# Patient Record
Sex: Female | Born: 1995 | Race: White | Hispanic: No | Marital: Single | Smoking: Never smoker
Health system: Southern US, Community
[De-identification: ages and names within clinical notes are randomized; demographics above are authoritative.]

## PROBLEM LIST (undated history)

## (undated) HISTORY — PX: FEMUR SURGERY: SHX943

---

## 2015-10-21 ENCOUNTER — Emergency Department
Admission: EM | Admit: 2015-10-21 | Discharge: 2015-10-21 | Disposition: A | Discharge status: Home / Self Care | Payer: Medicaid Other | Attending: Emergency Medicine | Admitting: Emergency Medicine

## 2015-10-21 ENCOUNTER — Encounter: Payer: Self-pay | Admitting: Emergency Medicine

## 2015-10-21 ENCOUNTER — Emergency Department: Payer: Medicaid Other

## 2015-10-21 DIAGNOSIS — R1011 Right upper quadrant pain: Secondary | ICD-10-CM | POA: Diagnosis not present

## 2015-10-21 DIAGNOSIS — R001 Bradycardia, unspecified: Secondary | ICD-10-CM | POA: Diagnosis not present

## 2015-10-21 DIAGNOSIS — Z3202 Encounter for pregnancy test, result negative: Secondary | ICD-10-CM | POA: Diagnosis not present

## 2015-10-21 DIAGNOSIS — R945 Abnormal results of liver function studies: Secondary | ICD-10-CM | POA: Insufficient documentation

## 2015-10-21 DIAGNOSIS — B9789 Other viral agents as the cause of diseases classified elsewhere: Secondary | ICD-10-CM

## 2015-10-21 DIAGNOSIS — R112 Nausea with vomiting, unspecified: Secondary | ICD-10-CM | POA: Diagnosis not present

## 2015-10-21 DIAGNOSIS — J069 Acute upper respiratory infection, unspecified: Secondary | ICD-10-CM

## 2015-10-21 DIAGNOSIS — R7989 Other specified abnormal findings of blood chemistry: Secondary | ICD-10-CM

## 2015-10-21 DIAGNOSIS — R05 Cough: Secondary | ICD-10-CM | POA: Diagnosis present

## 2015-10-21 LAB — COMPREHENSIVE METABOLIC PANEL
ALBUMIN: 4.2 g/dL (ref 3.5–5.0)
ALK PHOS: 105 U/L (ref 38–126)
ALT: 260 U/L — AB (ref 14–54)
AST: 190 U/L — AB (ref 15–41)
Anion gap: 8 (ref 5–15)
BILIRUBIN TOTAL: 0.9 mg/dL (ref 0.3–1.2)
BUN: 11 mg/dL (ref 6–20)
CO2: 22 mmol/L (ref 22–32)
Calcium: 9 mg/dL (ref 8.9–10.3)
Chloride: 106 mmol/L (ref 101–111)
Creatinine, Ser: 0.77 mg/dL (ref 0.44–1.00)
GFR calc Af Amer: >60 mL/min (ref >60)
GFR calc non Af Amer: >60 mL/min (ref >60)
GLUCOSE: 89 mg/dL (ref 65–99)
POTASSIUM: 3.9 mmol/L (ref 3.5–5.1)
Sodium: 136 mmol/L (ref 135–145)
TOTAL PROTEIN: 7.7 g/dL (ref 6.5–8.1)

## 2015-10-21 LAB — CBC WITH DIFFERENTIAL/PLATELET
BAND NEUTROPHILS: 0 %
BASOS PCT: 0 %
Basophils Absolute: 0 10*3/uL (ref 0–0.1)
Blasts: 0 %
EOS ABS: 0 10*3/uL (ref 0–0.7)
Eosinophils Relative: 0 %
HCT: 35 % (ref 35.0–47.0)
Hemoglobin: 11.9 g/dL — ABNORMAL LOW (ref 12.0–16.0)
LYMPHS PCT: 55 %
Lymphs Abs: 7.4 10*3/uL — ABNORMAL HIGH (ref 1.0–3.6)
MCH: 29.5 pg (ref 26.0–34.0)
MCHC: 33.9 g/dL (ref 32.0–36.0)
MCV: 87 fL (ref 80.0–100.0)
MONO ABS: 1.4 10*3/uL — AB (ref 0.2–0.9)
Metamyelocytes Relative: 1 %
Monocytes Relative: 10 %
Myelocytes: 0 %
NEUTROS ABS: 4.3 10*3/uL (ref 1.4–6.5)
Neutrophils Relative %: 31 %
OTHER: 3 %
PLATELETS: 157 10*3/uL (ref 150–440)
PROMYELOCYTES ABS: 0 %
RBC: 4.02 MIL/uL (ref 3.80–5.20)
RDW: 14.6 % — AB (ref 11.5–14.5)
WBC: 13.5 10*3/uL — ABNORMAL HIGH (ref 3.6–11.0)
nRBC: 0 /100 WBC

## 2015-10-21 LAB — URINALYSIS COMPLETE WITH MICROSCOPIC (ARMC ONLY)
BILIRUBIN URINE: NEGATIVE
Glucose, UA: NEGATIVE mg/dL
Hgb urine dipstick: NEGATIVE
KETONES UR: NEGATIVE mg/dL
Leukocytes, UA: NEGATIVE
Nitrite: NEGATIVE
Protein, ur: NEGATIVE mg/dL
RBC / HPF: NONE SEEN RBC/hpf (ref 0–5)
Specific Gravity, Urine: 1.003 — ABNORMAL LOW (ref 1.005–1.030)
WBC, UA: NONE SEEN WBC/hpf (ref 0–5)
pH: 7 (ref 5.0–8.0)

## 2015-10-21 LAB — MONONUCLEOSIS SCREEN: Mono Screen: NEGATIVE

## 2015-10-21 LAB — LIPASE, BLOOD: Lipase: 24 U/L (ref 11–51)

## 2015-10-21 LAB — PATHOLOGIST SMEAR REVIEW

## 2015-10-21 LAB — POCT PREGNANCY, URINE: Preg Test, Ur: NEGATIVE

## 2015-10-21 MED ORDER — SODIUM CHLORIDE 0.9 % IV BOLUS (SEPSIS)
1000.0000 mL | Freq: Once | INTRAVENOUS | Status: AC
  Administered 2015-10-21: 1000 mL via INTRAVENOUS

## 2015-10-21 MED ORDER — ONDANSETRON HCL 4 MG/2ML IJ SOLN
4.0000 mg | Freq: Once | INTRAMUSCULAR | Status: AC
  Administered 2015-10-21: 4 mg via INTRAVENOUS
  Filled 2015-10-21: qty 2

## 2015-10-21 MED ORDER — ONDANSETRON HCL 4 MG PO TABS: 4.0000 mg | ORAL_TABLET | Freq: Three times a day (TID) | ORAL | Status: AC | PRN

## 2015-10-21 NOTE — Discharge Instructions (Signed)
You were evaluated for 1 week of cough, and episode of abdominal pain and a couple episodes of vomiting, and although no certain cause was found, your exam and evaluation are reassuring in the emergency department today.  Return to the emergency department for any worsening symptoms including fever, new or worsening abdominal pain, pelvic pain, trouble breathing, or any other symptoms concerning to you.  Your liver enzymes were slightly elevated, and he should have this rechecked with the primary care physician and you are [facility] but you may try student health and see if they can follow you up this week.   Upper Respiratory Infection, Adult Most upper respiratory infections (URIs) are a viral infection of the air passages leading to the lungs. A URI affects the nose, throat, and upper air passages. The most common type of URI is nasopharyngitis and is typically referred to as "the common cold." URIs run their course and usually go away on their own. Most of the time, a URI does not require medical attention, but sometimes a bacterial infection in the upper airways can follow a viral infection. This is called a secondary infection. Sinus and middle ear infections are common types of secondary upper respiratory infections. Bacterial pneumonia can also complicate a URI. A URI can worsen asthma and chronic obstructive pulmonary disease (COPD). Sometimes, these complications can require emergency medical care and may be life threatening.  CAUSES Almost all URIs are caused by viruses. A virus is a type of germ and can spread from one person to another.  RISKS FACTORS You may be at risk for a URI if:   You smoke.   You have chronic heart or lung disease.  You have a weakened defense (immune) system.   You are very young or very old.   You have nasal allergies or asthma.  You work in crowded or poorly ventilated areas.  You work in health care facilities or  schools. SIGNS AND SYMPTOMS  Symptoms typically develop 2-3 days after you come in contact with a cold virus. Most viral URIs last 7-10 days. However, viral URIs from the influenza virus (flu virus) can last 14-18 days and are typically more severe. Symptoms may include:   Runny or stuffy (congested) nose.   Sneezing.   Cough.   Sore throat.   Headache.   Fatigue.   Fever.   Loss of appetite.   Pain in your forehead, behind your eyes, and over your cheekbones (sinus pain).  Muscle aches.  DIAGNOSIS  Your health care provider may diagnose a URI by:  Physical exam.  Tests to check that your symptoms are not due to another condition such as:  Strep throat.  Sinusitis.  Pneumonia.  Asthma. TREATMENT  A URI goes away on its own with time. It cannot be cured with medicines, but medicines may be prescribed or recommended to relieve symptoms. Medicines may help:  Reduce your fever.  Reduce your cough.  Relieve nasal congestion. HOME CARE INSTRUCTIONS   Take medicines only as directed by your health care provider.   Gargle warm saltwater or take cough drops to comfort your throat as directed by your health care provider.  Use a warm mist humidifier or inhale steam from a shower to increase air moisture. This may make it easier to breathe.  Drink enough fluid to keep your urine clear or pale yellow.   Eat soups and other clear broths and maintain good nutrition.   Rest as needed.  Return to work when your temperature has returned to normal or as your health care provider advises. You may need to stay home longer to avoid infecting others. You can also use a face mask and careful hand washing to prevent spread of the virus.  Increase the usage of your inhaler if you have asthma.   Do not use any tobacco products, including cigarettes, chewing tobacco, or electronic cigarettes. If you need help quitting, ask your health care provider. PREVENTION   The best way to protect yourself from getting a cold is to practice good hygiene.   Avoid oral or hand contact with people with cold symptoms.   Wash your hands often if contact occurs.  There is no clear evidence that vitamin C, vitamin E, echinacea, or exercise reduces the chance of developing a cold. However, it is always recommended to get plenty of rest, exercise, and practice good nutrition.  SEEK MEDICAL CARE IF:   You are getting worse rather than better.   Your symptoms are not controlled by medicine.   You have chills.  You have worsening shortness of breath.  You have brown or red mucus.  You have yellow or brown nasal discharge.  You have pain in your face, especially when you bend forward.  You have a fever.  You have swollen neck glands.  You have pain while swallowing.  You have white areas in the back of your throat. SEEK IMMEDIATE MEDICAL CARE IF:   You have severe or persistent:  Headache.  Ear pain.  Sinus pain.  Chest pain.  You have chronic lung disease and any of the following:  Wheezing.  Prolonged cough.  Coughing up blood.  A change in your usual mucus.  You have a stiff neck.  You have changes in your:  Vision.  Hearing.  Thinking.  Mood. MAKE SURE YOU:   Understand these instructions.  Will watch your condition.  Will get help right away if you are not doing well or get worse.   This information is not intended to replace advice given to you by your health care provider. Make sure you discuss any questions you have with your health care provider.   Document Released: 01/30/2001 Document Revised: 12/21/2014 Document Reviewed: 11/11/2013 Elsevier Interactive Patient Education 2016 [employer]  Abdominal Pain, Adult Many things can cause belly (abdominal) pain. Most times, the belly pain is not dangerous. Many cases of belly pain can be watched and treated at home. HOME CARE   Do not take medicines that  help you go poop (laxatives) unless told to by your doctor.  Only take medicine as told by your doctor.  Eat or drink as told by your doctor. Your doctor will tell you if you should be on a special diet. GET HELP IF:  You do not know what is causing your belly pain.  You have belly pain while you are sick to your stomach (nauseous) or have runny poop (diarrhea).  You have pain while you pee or poop.  Your belly pain wakes you up at night.  You have belly pain that gets worse or better when you eat.  You have belly pain that gets worse when you eat fatty foods.  You have a fever. GET HELP RIGHT AWAY IF:   The pain does not go away within 2 hours.  You keep throwing up (vomiting).  The pain changes and is only in the right or left part of the belly.  You have bloody or  tarry looking poop. MAKE SURE YOU:   Understand these instructions.  Will watch your condition.  Will get help right away if you are not doing well or get worse.   This information is not intended to replace advice given to you by your health care provider. Make sure you discuss any questions you have with your health care provider.   Document Released: 01/23/2008 Document Revised: 08/27/2014 Document Reviewed: 04/15/2013 Elsevier Interactive Patient Education [employer]

## 2015-10-21 NOTE — ED Provider Notes (Signed)
[facility] Emergency Department Provider Note   ____________________________________________  Time seen: Approximately 9:45 AM I have reviewed the triage vital signs and the triage nursing note.  HISTORY  Chief Complaint Emesis; Cough; and Abdominal Pain   Historian Patient  HPI Grace Bridges is a 20 y.o. 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 She states that she has felt under the weather for about a week with mild cough, reported swollen lymph nodes in her neck, and nausea. She vomited once last week and once today prior to arrival. This morning she had a sharp pain on the right upper abdomen area. She came in because she is concerned she could have appendicitis. No prior surgeries. No pelvic pain. No urinary symptoms. No fever.    History reviewed. No pertinent past medical history.  There are no active problems to display for this patient.   Past Surgical History  Procedure Laterality Date  . Femur surgery      No current outpatient prescriptions on file.  Allergies Review of patient's allergies indicates no known allergies.  No family history on file.  Social History Social History  Substance Use Topics  . Smoking status: Never Smoker   . Smokeless tobacco: None  . Alcohol Use: No    Review of Systems  Constitutional: Negative for fever. Eyes: Negative for visual changes. ENT: Negative for sore throat. Cardiovascular: Negative for chest pain. Respiratory: Positive for mild cough. Nonproductive. Gastrointestinal: Negative for diarrhea Genitourinary: Negative for dysuria. Musculoskeletal: Negative for back pain. Skin: Negative for rash. Neurological: Negative for headache. 10 point Review of Systems otherwise negative ____________________________________________   PHYSICAL EXAM:  VITAL SIGNS: ED Triage Vitals  Enc Vitals  Group     BP 10/21/15 0831 127/85 mmHg     Pulse Rate 10/21/15 0831 85     Resp 10/21/15 0831 18     Temp 10/21/15 0831 98.2 F (36.8 C)     Temp Source 10/21/15 0831 Oral     SpO2 10/21/15 0831 98 %     Weight 10/21/15 0831 130 lb (58.968 kg)     Height 10/21/15 0831 5\' 10"  (1.778 m)     Head Cir --      Peak Flow --      Pain Score 10/21/15 0831 6     Pain Loc --      Pain Edu? --      Excl. in GC? --      Constitutional: Alert and oriented. Well appearing and in no distress. HEENT   Head: Normocephalic and atraumatic.      Eyes: Conjunctivae are normal. PERRL. Normal extraocular movements.      Ears:         Nose: No congestion/rhinnorhea.   Mouth/Throat: Mucous membranes are mildly dry.   Neck: No stridor. Cardiovascular/Chest: Bradycardic regular..  No murmurs, rubs, or gallops. Respiratory: Normal respiratory effort without tachypnea nor retractions. Mild rhonchi right posterior base. Gastrointestinal: Soft. No distention, no guarding, no rebound. Nontender.    Genitourinary/rectal:Deferred Musculoskeletal: Nontender with normal range of motion in all extremities. No joint effusions.  No lower extremity tenderness.  No edema. Neurologic:  Normal speech and language. No gross or focal neurologic deficits are appreciated. Skin:  Skin is warm, dry and intact. No rash noted. Psychiatric: Mood and affect are normal. Speech and behavior are normal. Patient exhibits appropriate insight and judgment.  ____________________________________________  EKG I, Governor Rooks, MD, the attending physician have personally viewed and interpreted all ECGs.  None ____________________________________________  LABS (pertinent positives/negatives)  Urine pregnancy test negative Conference of metabolic panel significant for AST 190, a LT to 16 Lipase 24 White blood cell count 13.5, hemoglobin 11.9 Urinalysis negative except for rare bacteria Monospot  negative  ____________________________________________  RADIOLOGY All Xrays were viewed by me. Imaging interpreted by Radiologist.  Chest 2 view:  No edema or consolidation. Scarring left apex  Ultrasound right upper quadrant: Negative. No hepatobiliary abnormality identified. __________________________________________  PROCEDURES  Procedure(s) performed: None  Critical Care performed: None  ____________________________________________   ED COURSE / ASSESSMENT AND PLAN  Pertinent labs & imaging results that were available during my care of the patient were reviewed by me and considered in my medical decision making (see chart for details).   Patient is very well-appearing on exam, has no abdominal pain to palpation now. She pointed to her right upper quadrant, when describing where the pain was earlier. No right lower moderate tenderness to palpation. No pelvic pain or complaints.  She has had minor URI symptoms for about a week with some intermittent nausea and a few episodes of vomiting. She describes the lactose intolerant. Denies other symptoms of GERD.   Laboratory studies are reassuring except mild elevation of AST and ALT. Right upper quadrant ultrasound was obtained, and negative. I did add on the viral hepatitis B and C which will be sent out.  I discussed patient's results with her as well as return precautions and follow-up plan, it was student health or [facility].    CONSULTATIONS:   None   Patient / Family / Caregiver informed of clinical course, medical decision-making process, and agree with plan.   I discussed return precautions, follow-up instructions, and discharged instructions with patient and/or family.   ___________________________________________   FINAL CLINICAL IMPRESSION(S) / ED DIAGNOSES   Final diagnoses:  Abnormal LFTs  Upper respiratory infection  Right upper quadrant pain              Note: This dictation was  prepared with Dragon dictation. Any transcriptional errors that result from this process are unintentional   Governor Rooks, MD 10/21/15 1310

## 2015-10-21 NOTE — ED Notes (Signed)
Patient c/o RUQ abd pain. However no pain is present at the time of RN exam. Nontender. + Bowel Sounds x4. No alterations in bowel or bladder habits. Hx of N/V. Patient states possible lactose intolerance. Patient is A+Ox4 NAD noted

## 2015-10-21 NOTE — ED Notes (Signed)
Cold symptoms, vomiting off and on for a few days, RUQ pain began last weekend. Pt appears in no distress.

## 2015-10-22 LAB — HEPATITIS B SURFACE ANTIBODY, QUANTITATIVE: HEPATITIS B-POST: 59 m[IU]/mL

## 2015-10-22 LAB — HEPATITIS B CORE ANTIBODY, TOTAL: Hep B Core Total Ab: NEGATIVE

## 2015-10-22 LAB — HEPATITIS C ANTIBODY: HCV Ab: <0.1 s/co ratio (ref 0.0–0.9)

## 2015-10-22 LAB — HEPATITIS B SURFACE ANTIGEN: Hepatitis B Surface Ag: NEGATIVE

## 2017-10-26 IMAGING — US US ABDOMEN LIMITED
1 series · 14 of 25 positions shown · non-contrast
Comparison: None.

CLINICAL DATA: Elevated liver function tests.

EXAM:
US ABDOMEN LIMITED - RIGHT UPPER QUADRANT

[Series 1: us abdomen limited · 0.13mm/px · 14 of 77 slices shown]
[im 1/77]
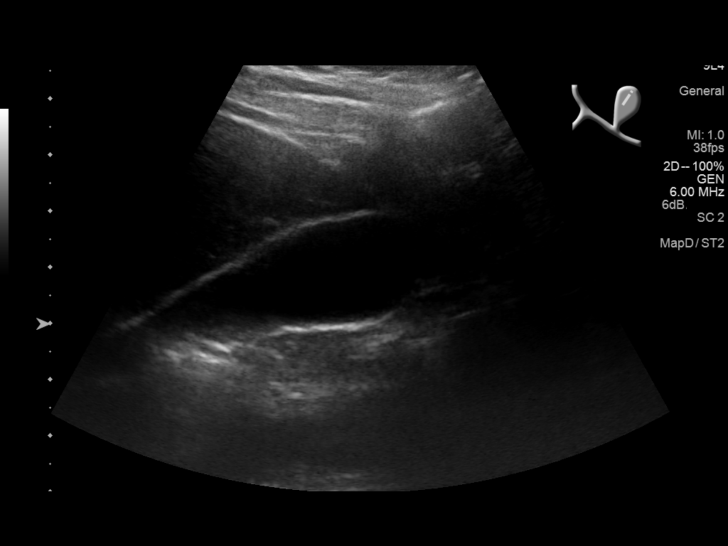
[im 7/77]
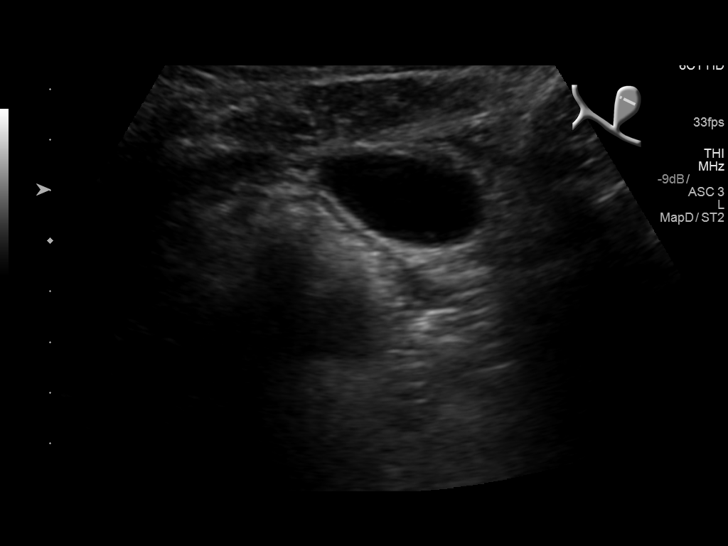
[im 13/77]
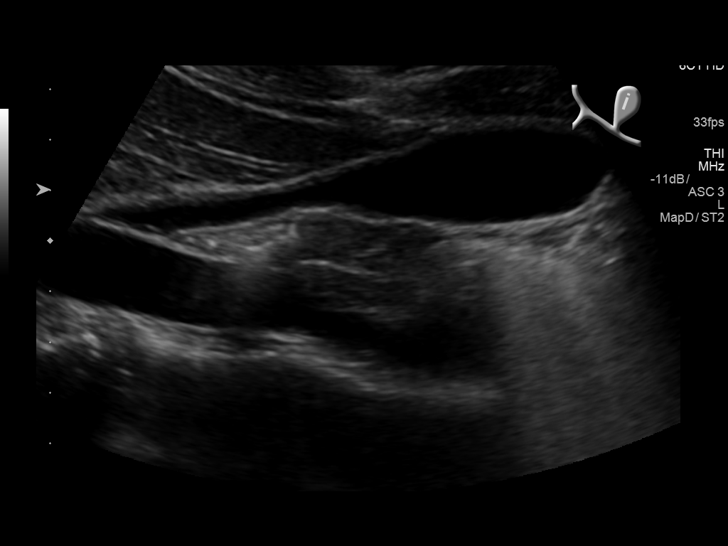
[im 20/77]
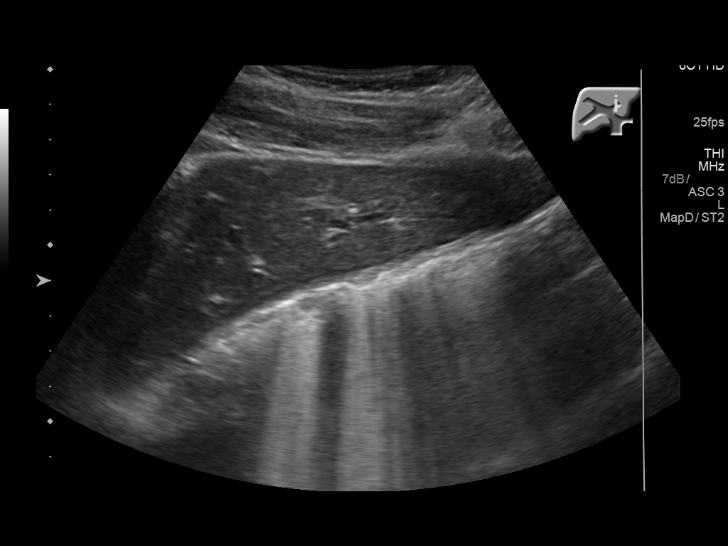
[im 26/77]
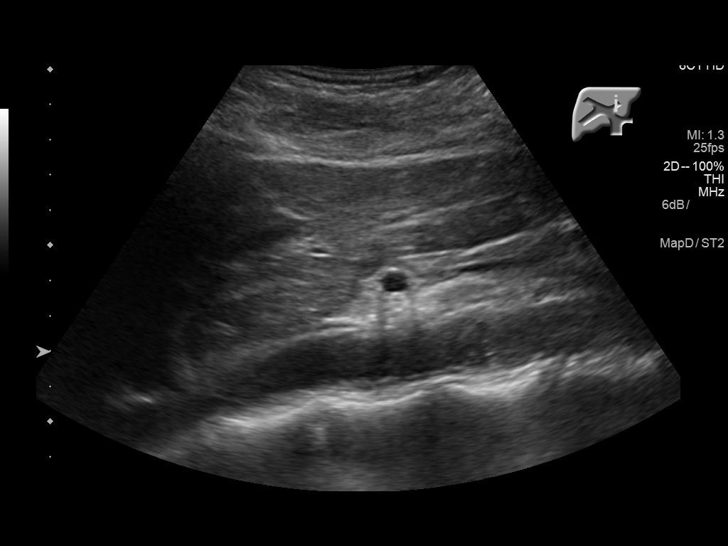
[im 29/77]
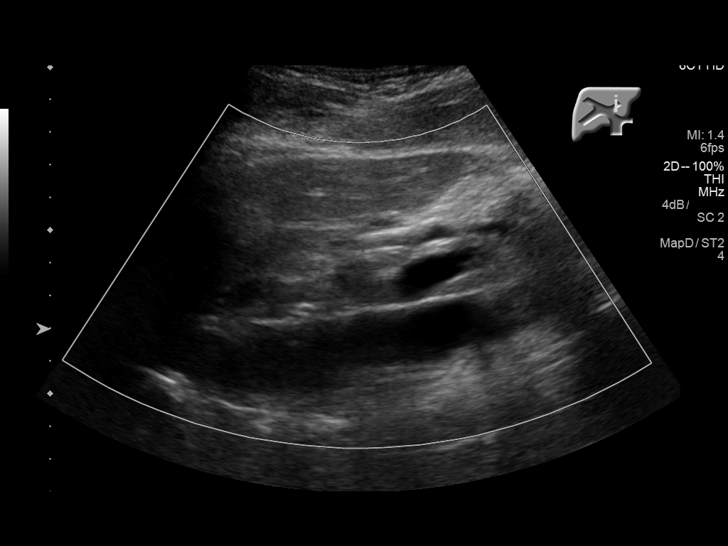
[im 35/77]
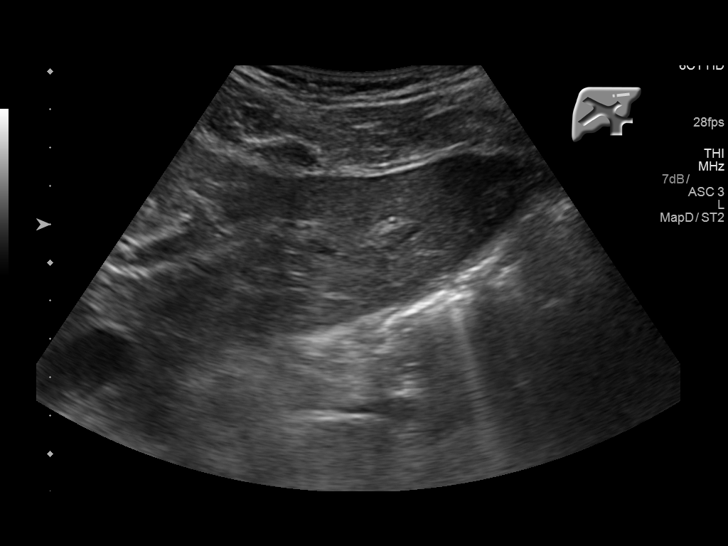
[im 42/77]
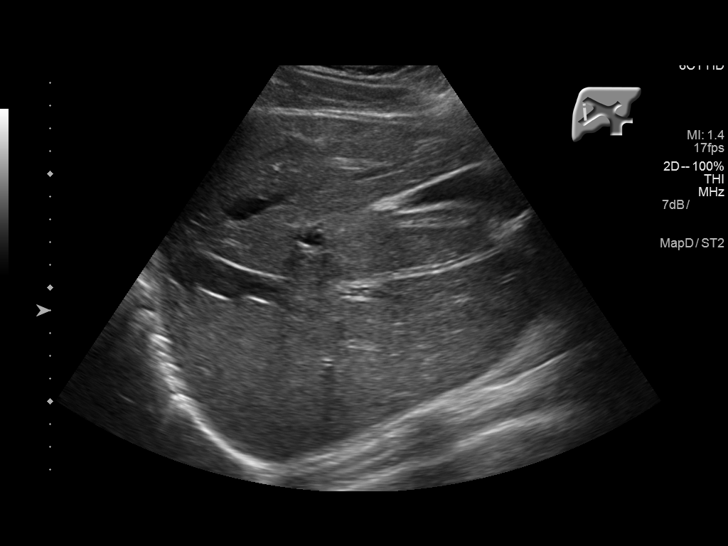
[im 48/77]
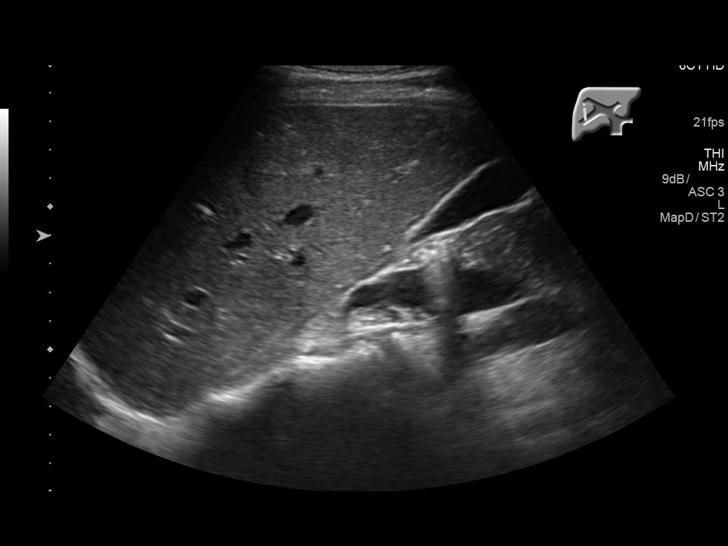
[im 51/77]
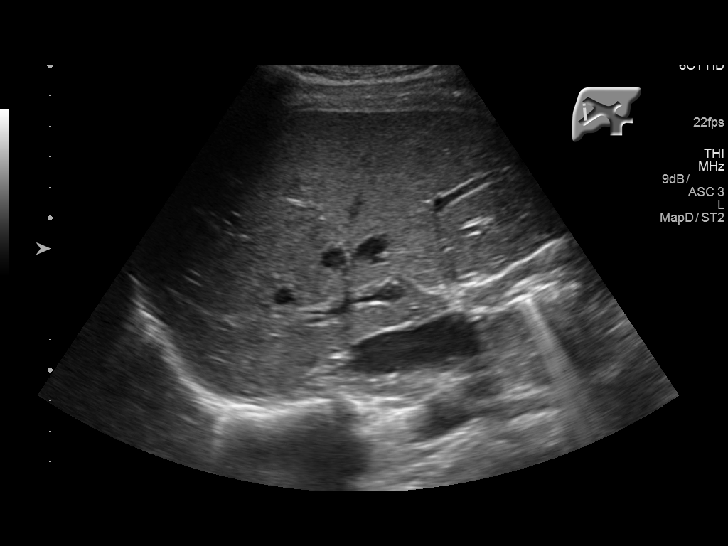
[im 58/77]
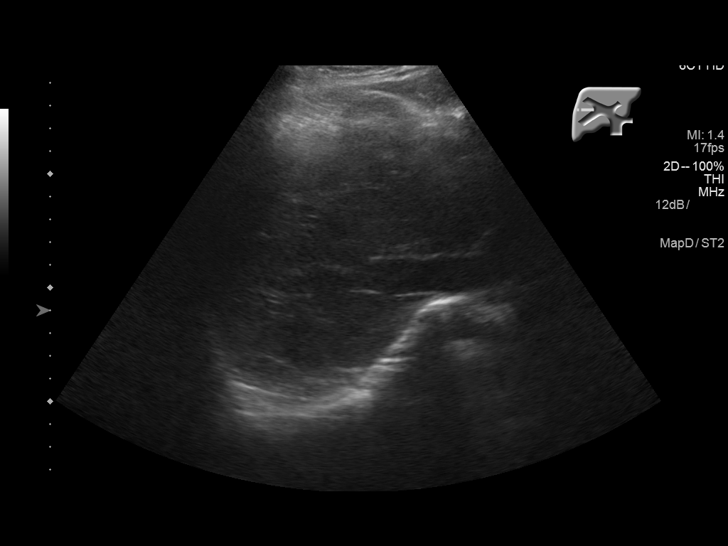
[im 64/77]
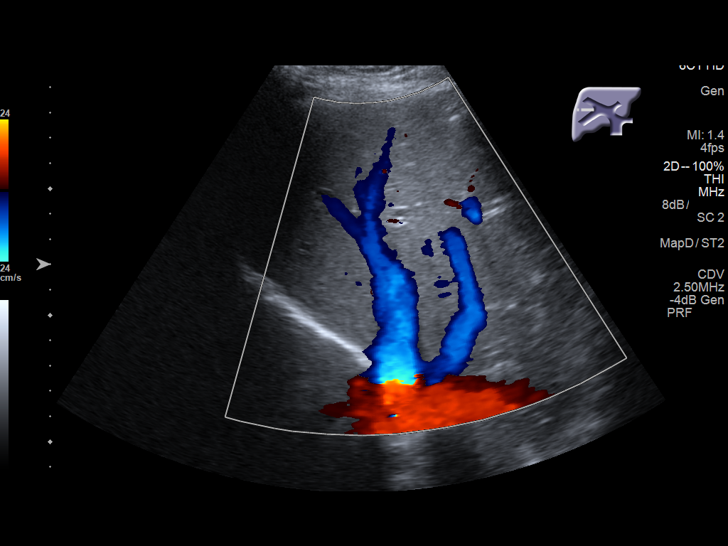
[im 70/77]
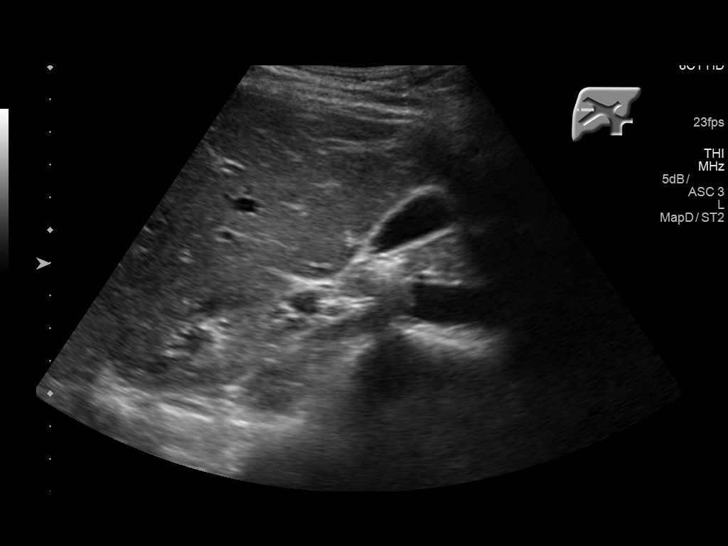
[im 77/77]
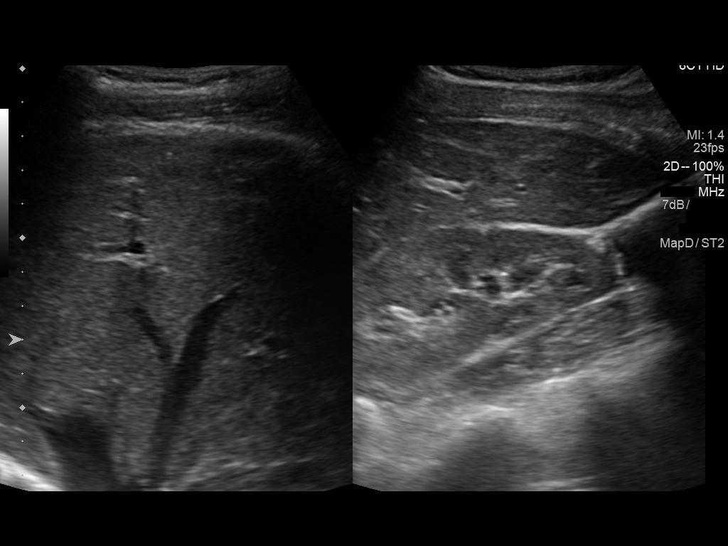

[14 of 25 positions shown; findings below may reference images not displayed]

FINDINGS: Gallbladder:

No gallstones or wall thickening visualized. No sonographic Murphy
sign noted by sonographer.

Common bile duct:

Diameter: 3 mm, within normal limits.

Liver:

No focal lesion identified. Within normal limits in parenchymal
echogenicity.
IMPRESSION: Negative.  No hepatobiliary abnormality identified.
